# Patient Record
Sex: Female | Born: 1965 | Race: White | Hispanic: No | Marital: Married | Smoking: Never smoker
Health system: Southern US, Community
[De-identification: ages and names within clinical notes are randomized; demographics above are authoritative.]

---
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
## 1989-06-12 HISTORY — PX: GALLBLADDER SURGERY: SHX652

## 1998-09-20 ENCOUNTER — Other Ambulatory Visit: Admission: RE | Admit: 1998-09-20 | Discharge: 1998-09-20 | Payer: Self-pay | Admitting: Obstetrics and Gynecology

## 1999-09-20 ENCOUNTER — Other Ambulatory Visit: Admission: RE | Admit: 1999-09-20 | Discharge: 1999-09-20 | Payer: Self-pay | Admitting: Obstetrics and Gynecology

## 2000-02-28 ENCOUNTER — Encounter: Payer: Self-pay | Admitting: Obstetrics and Gynecology

## 2000-02-28 ENCOUNTER — Encounter: Admission: RE | Admit: 2000-02-28 | Discharge: 2000-02-28 | Payer: Self-pay | Admitting: Obstetrics and Gynecology

## 2000-09-24 ENCOUNTER — Other Ambulatory Visit: Admission: RE | Admit: 2000-09-24 | Discharge: 2000-09-24 | Payer: Self-pay | Admitting: Obstetrics and Gynecology

## 2000-12-07 ENCOUNTER — Ambulatory Visit (HOSPITAL_COMMUNITY): Admission: RE | Admit: 2000-12-07 | Discharge: 2000-12-07 | Payer: Self-pay | Admitting: Gastroenterology

## 2000-12-07 ENCOUNTER — Encounter (INDEPENDENT_AMBULATORY_CARE_PROVIDER_SITE_OTHER): Payer: Self-pay | Admitting: Specialist

## 2001-03-04 ENCOUNTER — Encounter: Payer: Self-pay | Admitting: Obstetrics and Gynecology

## 2001-03-04 ENCOUNTER — Encounter: Admission: RE | Admit: 2001-03-04 | Discharge: 2001-03-04 | Payer: Self-pay | Admitting: Obstetrics and Gynecology

## 2001-10-09 ENCOUNTER — Other Ambulatory Visit: Admission: RE | Admit: 2001-10-09 | Discharge: 2001-10-09 | Payer: Self-pay | Admitting: Obstetrics and Gynecology

## 2002-03-10 ENCOUNTER — Encounter: Admission: RE | Admit: 2002-03-10 | Discharge: 2002-03-10 | Payer: Self-pay | Admitting: Obstetrics and Gynecology

## 2002-03-10 ENCOUNTER — Encounter: Payer: Self-pay | Admitting: Obstetrics and Gynecology

## 2002-12-03 ENCOUNTER — Other Ambulatory Visit: Admission: RE | Admit: 2002-12-03 | Discharge: 2002-12-03 | Payer: Self-pay | Admitting: Obstetrics and Gynecology

## 2004-05-03 ENCOUNTER — Other Ambulatory Visit: Admission: RE | Admit: 2004-05-03 | Discharge: 2004-05-03 | Payer: Self-pay | Admitting: Obstetrics and Gynecology

## 2005-12-15 ENCOUNTER — Encounter: Admission: RE | Admit: 2005-12-15 | Discharge: 2005-12-15 | Payer: Self-pay | Admitting: Obstetrics and Gynecology

## 2005-12-22 ENCOUNTER — Encounter (INDEPENDENT_AMBULATORY_CARE_PROVIDER_SITE_OTHER): Payer: Self-pay | Admitting: Specialist

## 2005-12-22 ENCOUNTER — Encounter: Admission: RE | Admit: 2005-12-22 | Discharge: 2005-12-22 | Payer: Self-pay | Admitting: Obstetrics and Gynecology

## 2006-02-07 ENCOUNTER — Encounter (INDEPENDENT_AMBULATORY_CARE_PROVIDER_SITE_OTHER): Payer: Self-pay | Admitting: *Deleted

## 2006-02-07 ENCOUNTER — Ambulatory Visit (HOSPITAL_BASED_OUTPATIENT_CLINIC_OR_DEPARTMENT_OTHER): Admission: RE | Admit: 2006-02-07 | Discharge: 2006-02-07 | Payer: Self-pay | Admitting: General Surgery

## 2006-02-07 ENCOUNTER — Encounter: Admission: RE | Admit: 2006-02-07 | Discharge: 2006-02-07 | Payer: Self-pay | Admitting: General Surgery

## 2006-12-19 ENCOUNTER — Encounter: Admission: RE | Admit: 2006-12-19 | Discharge: 2006-12-19 | Payer: Self-pay | Admitting: Obstetrics and Gynecology

## 2007-12-06 ENCOUNTER — Encounter: Admission: RE | Admit: 2007-12-06 | Discharge: 2007-12-06 | Payer: Self-pay | Admitting: Obstetrics and Gynecology

## 2008-12-08 ENCOUNTER — Encounter: Admission: RE | Admit: 2008-12-08 | Discharge: 2008-12-08 | Payer: Self-pay | Admitting: Obstetrics and Gynecology

## 2009-06-12 HISTORY — PX: BREAST LUMPECTOMY: SHX2

## 2010-01-24 ENCOUNTER — Encounter: Admission: RE | Admit: 2010-01-24 | Discharge: 2010-01-24 | Payer: Self-pay | Admitting: Obstetrics and Gynecology

## 2010-03-12 ENCOUNTER — Emergency Department (HOSPITAL_BASED_OUTPATIENT_CLINIC_OR_DEPARTMENT_OTHER): Admission: EM | Admit: 2010-03-12 | Discharge: 2010-03-13 | Payer: Self-pay | Admitting: Emergency Medicine

## 2010-07-04 ENCOUNTER — Encounter: Payer: Self-pay | Admitting: Obstetrics and Gynecology

## 2010-10-28 NOTE — Op Note (Signed)
NAMEYAMILEX, BORGWARDT              ACCOUNT NO.:  [id number]   MEDICAL RECORD NO.:  [id number]          PATIENT TYPE:  AMB   LOCATION:  DSC                          FACILITY:  [facility]   PHYSICIAN:  Rose Phi. Young, M.D.   DATE OF BIRTH:  07/26/1965   DATE OF PROCEDURE:  02/07/2006  DATE OF DISCHARGE:                                 OPERATIVE REPORT   PREOPERATIVE DIAGNOSIS:  Abnormal left breast mammogram with  microcalcifications and radial scar in the medial portion of the left  breast.   POSTOPERATIVE DIAGNOSIS:  Abnormal left breast mammogram with  microcalcifications and radial scar in the medial portion of the left  breast.   OPERATION:  Left breast biopsy with needle localization and specimen  mammogram.   SURGEON:  Rose Phi. Maple Hudson, M.D.   ANESTHESIA:  General.   OPERATIVE PROCEDURE:  Prior to coming to the operating room a localizing  wire had been placed in the abnormal area in the medial portion of the left  breast.   After suitable general anesthesia was induced the patient was placed in the  supine position with the arms extended on the arm board.  The left breast  was prepped and draped in the usual fashion.   The approach of the needle was from medial to the lateral in the medial  portion of the left breast, and a short radial incision was then outlined  and the area infiltrated with 0.25% Marcaine with adrenaline.  The incision  was made, with the wire exposed in the incision, the area of breast was  excised.  The specimen mammogram confirmed the removal of the lesion.   We had good hemostasis.  I approximated the thin rim of breast tissue that  she had with 3-0 Vicryl and then the skin was closed with subcuticular 4-0  Monocryl and Steri-Strips.  Dressing applied.  The patient was transferred  to the recovery room in satisfactory condition having tolerated the  procedure well.      Rose Phi. Maple Hudson, M.D.  Electronically Signed     PRY/MEDQ  D:  02/07/2006   T:  02/08/2006  Job:  [id number]

## 2010-10-28 NOTE — Op Note (Signed)
[facility]  Patient:    Christie Murray, Christie Murray                       MRN: [id number] Proc. Date: 12/07/00 Adm. Date:  [id number] Attending:  Orland Mustard CC:         Genene Churn. Cyndie Chime, M.D.  Wayne C. Dorna Bloom, M.D.   Operative Report  PROCEDURE:  Esophagogastroduodenoscopy and biopsy.  MEDICATIONS:  Hurricaine Spray, fentanyl 60 mcg, Versed 6 mg IV.  INDICATIONS:  Persistent long history of dyspepsia felt to be due to esophageal reflux.  She has been on antipeptic therapy most recently with Nexium.  She has continued to be iron deficient.  In view of this, an endoscopy is performed to look for any source in her upper GI tract.  DESCRIPTION OF PROCEDURE:  The procedure had been explained to the patient and consent obtained.  The patient in the left lateral decubitus position, Olympus videoendoscope was inserted blindly into the esophagus and advanced under direct visualization.  The stomach was entered, pylorus identified and passed. The duodenum including the bulb and second portion ______ was unremarkable. Biopsies were taken to look for evidence of celiac disease.  Scope was withdrawn.  The gastric outlet was normal.  The antrum and body were completely normal without ulcer or inflammation.  The scope was ______ back in the esophagus.  There was a 1-2 cm hiatal hernia.  No gross esophagitis, esophageal ulceration, or inflammation.  The scope was withdrawn and the patient tolerated the procedure well.  She was maintained on low-flow oxygen and pulse oximetry throughout the procedure.  ASSESSMENT:  Hiatal hernia with gastroesophageal reflux.  PLAN:  We will keep on the same medications for now, specifically Nexium and iron.  We will see her back in the office in six months.  We will check biopsies for evidence of celiac disease. DD:  12/07/00 TD:  12/07/00 Job: [id number]

## 2011-01-02 ENCOUNTER — Other Ambulatory Visit: Payer: Self-pay | Admitting: Obstetrics and Gynecology

## 2011-01-02 DIAGNOSIS — Z1231 Encounter for screening mammogram for malignant neoplasm of breast: Secondary | ICD-10-CM

## 2011-01-31 ENCOUNTER — Ambulatory Visit: Payer: Self-pay

## 2011-02-08 ENCOUNTER — Ambulatory Visit
Admission: RE | Admit: 2011-02-08 | Discharge: 2011-02-08 | Disposition: A | Discharge status: Home / Self Care | Payer: 59 | Source: Ambulatory Visit | Attending: Obstetrics and Gynecology | Admitting: Obstetrics and Gynecology

## 2011-02-08 ENCOUNTER — Other Ambulatory Visit: Payer: Self-pay | Admitting: Obstetrics and Gynecology

## 2011-02-08 DIAGNOSIS — Z1231 Encounter for screening mammogram for malignant neoplasm of breast: Secondary | ICD-10-CM

## 2013-04-22 IMAGING — MG MM DIGITAL SCREENING W/ IMPLANTS {BCG}
3 series · 3 of 3 positions shown · non-contrast
Comparison: none

DG SCREENING W/IMPLANTS
Bilateral CC and MLO view(s) were taken.

DIGITAL SCREENING MAMMOGRAM WITH CAD:
The breast tissue is extremely dense.  No masses or malignant type calcifications are identified.  
Compared with prior studies.
Images were processed with CAD.

[L CC]
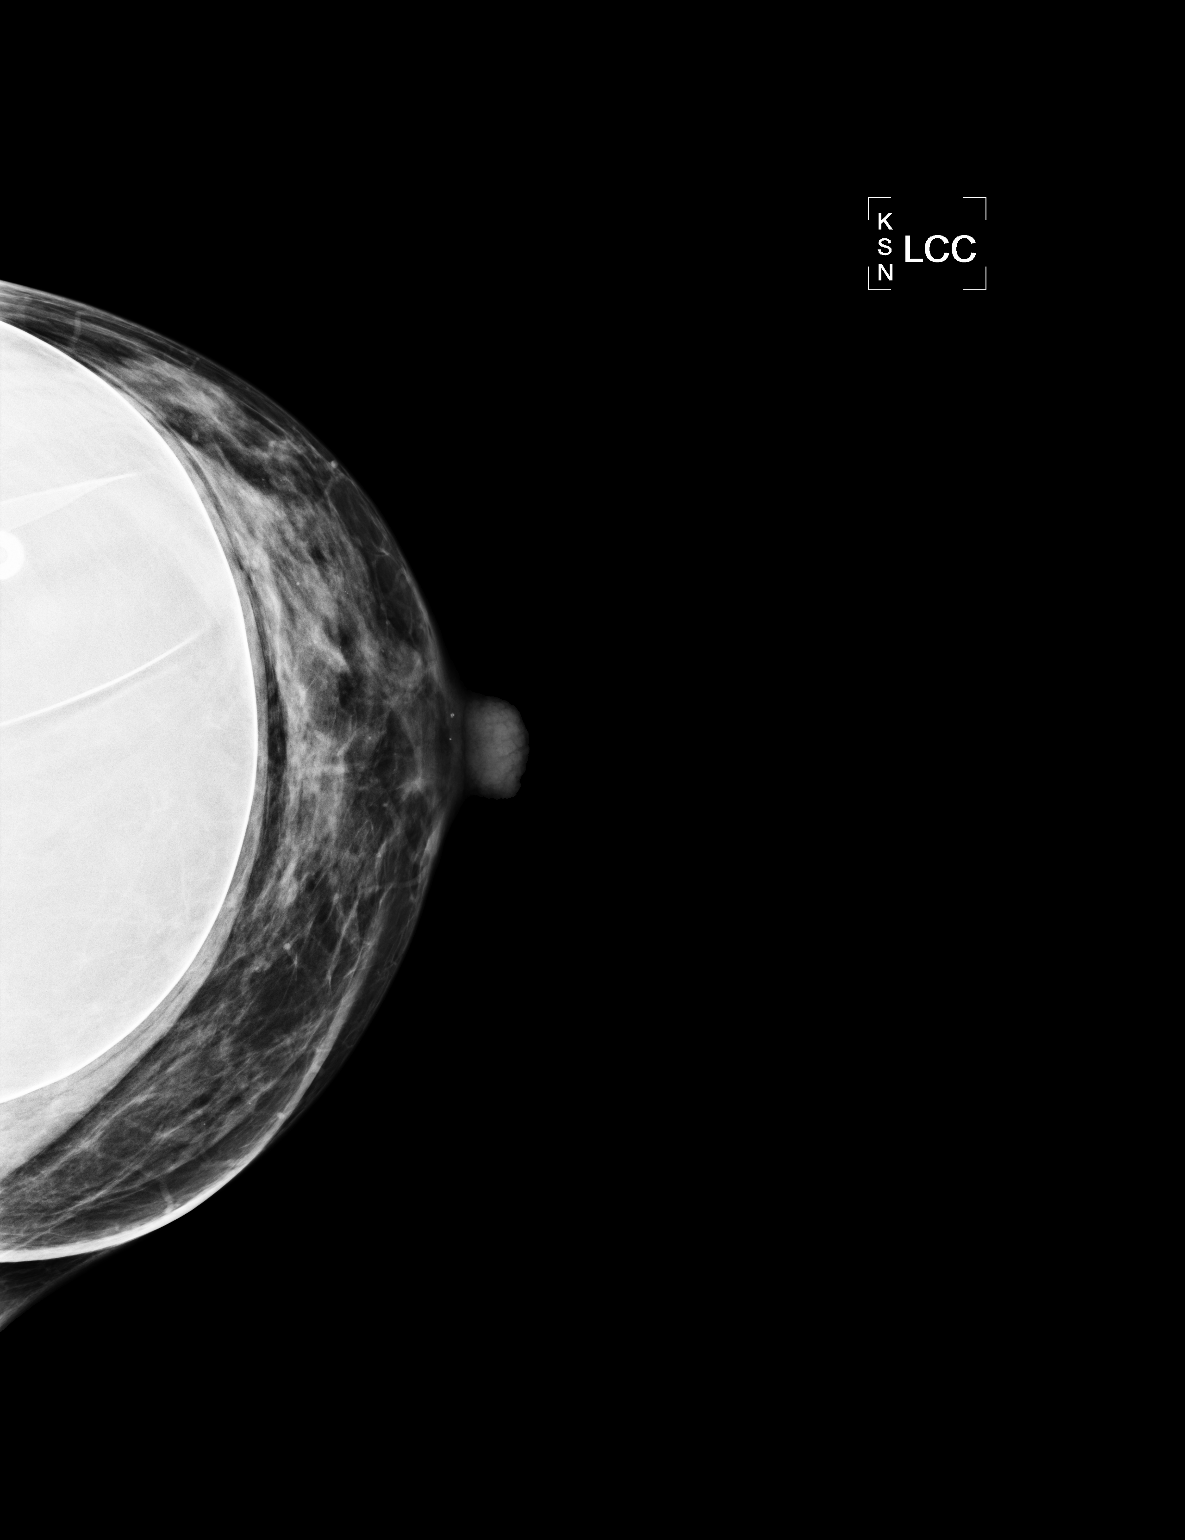

[L MLO]
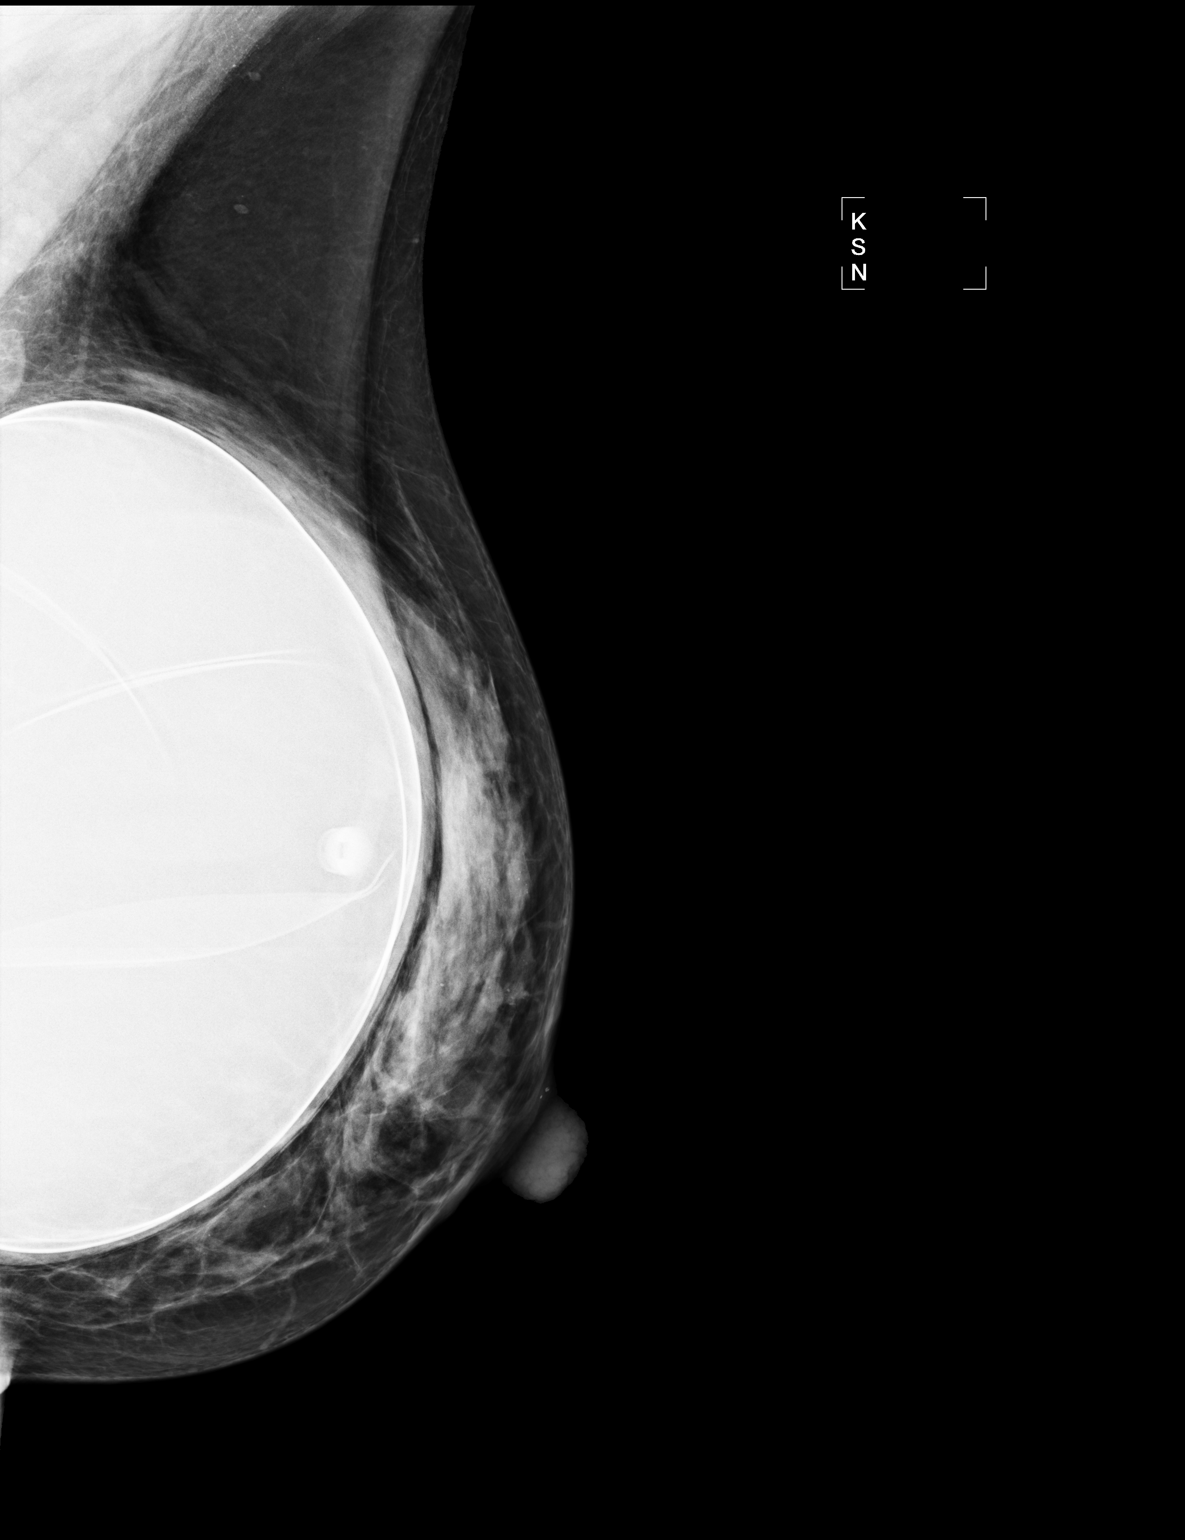

[R MLO]
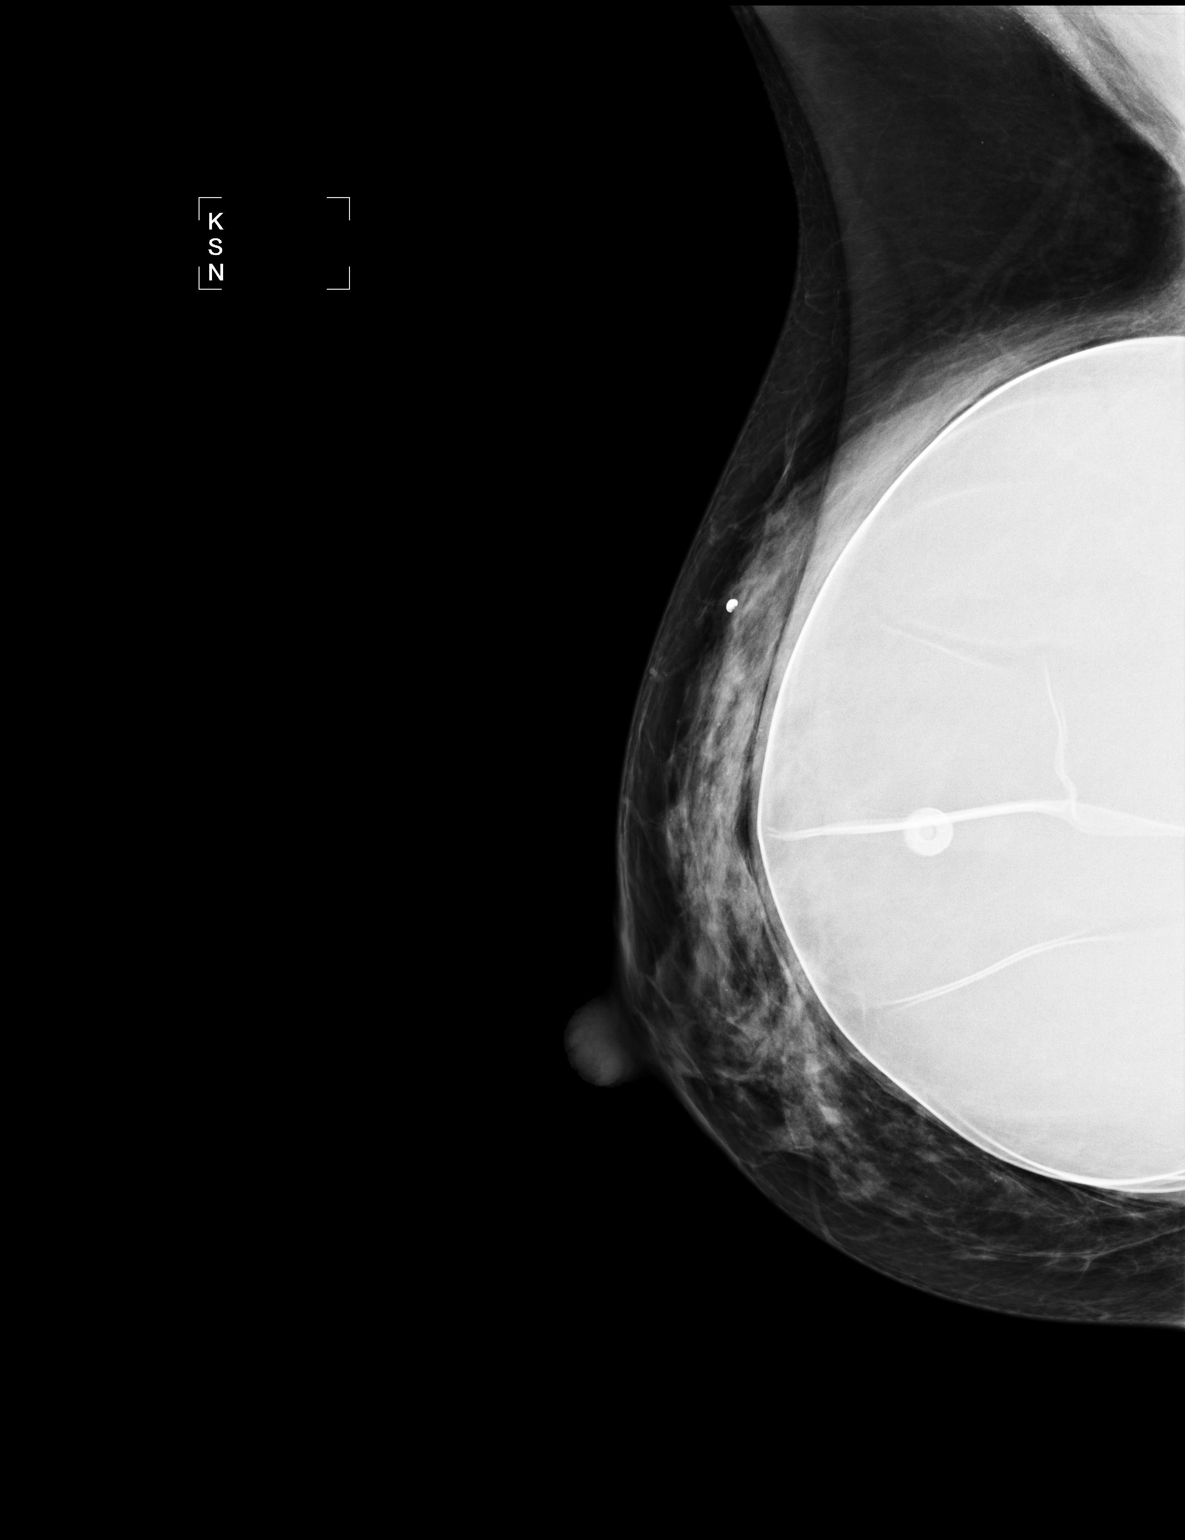

[3 of 3 positions shown; findings below may reference images not displayed]

IMPRESSION: No specific mammographic evidence of malignancy.  Next screening mammogram is recommended in one 
year.

A result letter of this screening mammogram will be mailed directly to the patient.

ASSESSMENT: Benign - BI-RADS 2

Screening mammogram in 1 year.
,

## 2014-08-05 ENCOUNTER — Other Ambulatory Visit: Payer: Self-pay | Admitting: Obstetrics and Gynecology

## 2014-08-06 ENCOUNTER — Telehealth: Payer: Self-pay | Admitting: Cardiovascular Disease

## 2014-08-06 LAB — CYTOLOGY - PAP

## 2014-08-06 NOTE — Telephone Encounter (Signed)
Received records from [facility] for appointment with Dr Allyson SabalBerry on 09/15/14.  [facility] Dr Hazle CocaBerry's schedule on 09/15/14.  lp

## 2014-09-09 ENCOUNTER — Telehealth: Payer: Self-pay | Admitting: Cardiology

## 2014-09-11 NOTE — Telephone Encounter (Signed)
Closed encounter °

## 2014-09-15 ENCOUNTER — Ambulatory Visit: Payer: 59 | Admitting: Cardiovascular Disease

## 2014-09-18 ENCOUNTER — Ambulatory Visit (INDEPENDENT_AMBULATORY_CARE_PROVIDER_SITE_OTHER): Payer: 59 | Admitting: Cardiology

## 2014-09-18 ENCOUNTER — Encounter: Payer: Self-pay | Admitting: Cardiology

## 2014-09-18 VITALS — BP 132/94 | HR 50 | Ht 64.5 in | Wt 122.9 lb

## 2014-09-18 DIAGNOSIS — I493 Ventricular premature depolarization: Secondary | ICD-10-CM | POA: Diagnosis not present

## 2014-09-18 NOTE — Progress Notes (Signed)
Cardiology Office Note   Date:  09/18/2014   ID:  Christie ArnJami M Stave, DOB 10-14-1965, MRN [id number]  PCP:  Almedia BallsKELLY,SAM, MD  Cardiologist:   Rollene RotundaJames Akire Rennert, MD   Chief Complaint  Patient presents with  . PVCs      History of Present Illness: Christie Murray is a 49 y.o. female who presents for evaluation of palpitations. She has no prior cardiac history. However, she's been noticing some skipped heartbeats. She describes these particularly at night starting about a year and a half ago. She would feel positive and a strong beat. This might happen several times but not in a row. She doesn't describe any sustained tachycardia arrhythmias. She doesn't notice it when she is active or exercising which she does quite a bit. She does noted occasionally when she is at rest now during the day and this has started more recently. She doesn't have any presyncope or syncope. She runs and does other vigorous aerobic exercise and with this she denies any chest pressure, neck or arm discomfort. She denies any shortness of breath, PND or orthopnea. She has no weight change.   No past medical history on file.  Past Surgical History  Procedure Laterality Date  . Breast lumpectomy Left 2011  . Gallbladder surgery  1991  . Appendectomy  1984  . Cesarean section  1995     Current Outpatient Prescriptions  Medication Sig Dispense Refill  . estradiol (ESTRACE) 2 MG tablet Take 1 tablet by mouth daily.    . progesterone (PROMETRIUM) 100 MG capsule Take 1 capsule by mouth daily.     No current facility-administered medications for this visit.    Allergies:   Review of patient's allergies indicates no known allergies.    Social History:  The patient  reports that she has never smoked. She does not have any smokeless tobacco history on file. She reports that she drinks about 0.6 oz of alcohol per week. She reports that she does not use illicit drugs.   Family History:  The patient's family history  includes Cancer - Lung in her mother; Stroke in her father.    ROS:  Please see the history of present illness.   Otherwise, review of systems are positive for none.   All other systems are reviewed and negative.    PHYSICAL EXAM: VS:  BP 132/94 mmHg  Pulse 50  Ht 5' 4.5" (1.638 m)  Wt 122 lb 14.4 oz (55.747 kg)  BMI 20.78 kg/m2 , BMI Body mass index is 20.78 kg/(m^2). GENERAL:  Well appearing HEENT:  Pupils equal round and reactive, fundi not visualized, oral mucosa unremarkable NECK:  No jugular venous distention, waveform within normal limits, carotid upstroke brisk and symmetric, no bruits, no thyromegaly LYMPHATICS:  No cervical, inguinal adenopathy LUNGS:  Clear to auscultation bilaterally BACK:  No CVA tenderness CHEST:  Unremarkable HEART:  PMI not displaced or sustained,S1 and S2 within normal limits, no S3, no S4, no clicks, no rubs, no murmurs ABD:  Flat, positive bowel sounds normal in frequency in pitch, no bruits, no rebound, no guarding, no midline pulsatile mass, no hepatomegaly, no splenomegaly EXT:  2 plus pulses throughout, no edema, no cyanosis no clubbing SKIN:  No rashes no nodules NEURO:  Cranial nerves II through XII grossly intact, motor grossly intact throughout PSYCH:  Cognitively intact, oriented to person place and time    EKG:  EKG is ordered today. The ekg ordered today demonstrates sinus bradycardia, rate 50, axis within  normal limits, RSR prime V1 and V2, no acute ST-T wave changes.   Recent Labs: No results found for requested labs within last 365 days.    Lipid Panel    Wt Readings from Last 3 Encounters:  09/18/14 122 lb 14.4 oz (55.747 kg)      Other studies Reviewed: Additional studies/ records that were reviewed today include: Outside office note and labs .    ASSESSMENT AND PLAN:  PALPITATIONS:  The patient is likely describing PVCs or PACs. She has an absolutely normal exam and normal EKG. Given this the likelihood of some  more sustained or significant dysrhythmia is low. We talked about the pathophysiology of this and treatment for some time. She would prefer no medical therapy but will let me know if this gets worse with time. No further imaging is indicated.  ELEVATED LDL:  Of note her LDL was 161 but her HDL is 94. She has no vascular disease or other high-risk findings. Treat as needed.   Current medicines are reviewed at length with the patient today.  The patient does not have concerns regarding medicines.  The following changes have been made:  no change  Labs/ tests ordered today include: None     Disposition:   FU with me as needed.     Signed, Rollene Rotunda, MD  09/18/2014 1:03 PM    [facility]

## 2014-09-18 NOTE — Patient Instructions (Signed)
Follow up with Dr Hochrein as needed.  

## 2016-08-20 ENCOUNTER — Emergency Department (HOSPITAL_BASED_OUTPATIENT_CLINIC_OR_DEPARTMENT_OTHER)
Admission: EM | Admit: 2016-08-20 | Discharge: 2016-08-20 | Disposition: A | Discharge status: Home / Self Care | Payer: 59 | Attending: Emergency Medicine | Admitting: Emergency Medicine

## 2016-08-20 ENCOUNTER — Encounter (HOSPITAL_BASED_OUTPATIENT_CLINIC_OR_DEPARTMENT_OTHER): Payer: Self-pay

## 2016-08-20 DIAGNOSIS — H5711 Ocular pain, right eye: Secondary | ICD-10-CM | POA: Diagnosis present

## 2016-08-20 DIAGNOSIS — Z79899 Other long term (current) drug therapy: Secondary | ICD-10-CM | POA: Diagnosis not present

## 2016-08-20 DIAGNOSIS — H1031 Unspecified acute conjunctivitis, right eye: Secondary | ICD-10-CM | POA: Diagnosis not present

## 2016-08-20 DIAGNOSIS — B309 Viral conjunctivitis, unspecified: Secondary | ICD-10-CM

## 2016-08-20 MED ORDER — FLUORESCEIN SODIUM 0.6 MG OP STRP
ORAL_STRIP | OPHTHALMIC | Status: AC
  Filled 2016-08-20: qty 1

## 2016-08-20 MED ORDER — TETRACAINE HCL 0.5 % OP SOLN
OPHTHALMIC | Status: AC
  Filled 2016-08-20: qty 4

## 2016-08-20 MED ORDER — POLYMYXIN B-TRIMETHOPRIM 10000-0.1 UNIT/ML-% OP SOLN: 1.0000 [drp] | OPHTHALMIC | 0 refills | Status: DC

## 2016-08-20 NOTE — ED Notes (Signed)
Pt discharged to home with family. NAD.  

## 2016-08-20 NOTE — ED Triage Notes (Signed)
Right eye pain with redness and drainage. Reports using drops but not helping today.

## 2016-08-20 NOTE — ED Provider Notes (Signed)
[facility]   CSN: [id number] Arrival date & time: 08/20/16  1227     History   Chief Complaint Chief Complaint  Patient presents with  . Eye Pain    HPI Christie Murray is a 51 y.o. female.  HPI  51 year old female presenting with redness and pain to the right eye. Woke up this morning with the symptoms. Slight improvement since waking up. She freely has dry eyes and uses eyedrops on most daily. She does not follow with an eye specialist. However today she has been having the redness in the eyes and a little bit of periorbital swelling throughout the day. She thinks this might be because she's been rubbing it so much. No headache, nausea, or vomiting. She's had a little rhinorrhea today. She's had an increase in clear watery discharge. Husband recalls that they were in a hot tub last night. She does not wear contacts or glasses. She thinks her right eye is a little bit blurry. Describes the pain as a burning sensation. It feels like there's a scratching or foreign body in her eye. She is not any trauma.  History reviewed. No pertinent past medical history.  Patient Active Problem List   Diagnosis Date Noted  . PVC (premature ventricular contraction) 09/18/2014    Past Surgical History:  Procedure Laterality Date  . APPENDECTOMY  1984  . BREAST LUMPECTOMY Left 2011  . CESAREAN SECTION  1995  . GALLBLADDER SURGERY  1991    OB History    No data available       Home Medications    Prior to Admission medications   Medication Sig Start Date End Date Taking? Authorizing Provider  estradiol (ESTRACE) 2 MG tablet Take 1 tablet by mouth daily. 09/01/14   Historical Provider, MD  progesterone (PROMETRIUM) 100 MG capsule Take 1 capsule by mouth daily. 09/01/14   Historical Provider, MD  trimethoprim-polymyxin b (POLYTRIM) ophthalmic solution Place 1 drop into the right eye every 4 (four) hours. For 7 days 08/20/16   Pricilla Loveless, MD    Family  History Family History  Problem Relation Age of Onset  . Cancer - Lung Mother     Died age 57  . Stroke Father   . Heart disease Maternal Grandmother     Lived into her 43s    Social History Social History  Substance Use Topics  . Smoking status: Never Smoker  . Smokeless tobacco: Never Used  . Alcohol use 0.6 oz/week    1 Glasses of wine per week     Allergies   Patient has no known allergies.   Review of Systems Review of Systems  Constitutional: Negative for fever.  HENT: Positive for rhinorrhea. Negative for sore throat.   Eyes: Positive for photophobia, pain, discharge (clear), redness, itching and visual disturbance.  Respiratory: Negative for cough and shortness of breath.   Gastrointestinal: Negative for vomiting.  Neurological: Negative for headaches.  All other systems reviewed and are negative.    Physical Exam Updated Vital Signs BP 143/97 (BP Location: Left Arm)   Pulse (!) 55   Temp 99 F (37.2 C) (Oral)   Resp 22   Ht 5' 4.5" (1.638 m)   Wt 117 lb 3.2 oz (53.2 kg)   SpO2 100%   BMI 19.81 kg/m   Physical Exam  Constitutional: She is oriented to person, place, and time. She appears well-developed and well-nourished.  HENT:  Head: Normocephalic and atraumatic.  Right Ear: External ear  normal.  Left Ear: External ear normal.  Nose: Nose normal.  Mild right lid swelling with faint erythema. No significant tenderness or induration  Eyes: EOM are normal. Pupils are equal, round, and reactive to light. Right eye exhibits discharge (watery). Right eye exhibits no exudate. Left eye exhibits no discharge. Right conjunctiva is injected.  Slit lamp exam:      The right eye shows no corneal abrasion and no fluorescein uptake.  IOP 20 in right eye No obvious fluorescein uptake  Cardiovascular: Normal rate, regular rhythm and normal heart sounds.   Pulmonary/Chest: Effort normal and breath sounds normal.  Abdominal: Soft. There is no tenderness.    Neurological: She is alert and oriented to person, place, and time.  Skin: Skin is warm and dry.  Nursing note and vitals reviewed.    ED Treatments / Results  Labs (all labs ordered are listed, but only abnormal results are displayed) Labs Reviewed - No data to display  EKG  EKG Interpretation None       Radiology No results found.  Procedures Procedures (including critical care time)  Medications Ordered in ED Medications  fluorescein 0.6 MG ophthalmic strip (not administered)  tetracaine (PONTOCAINE) 0.5 % ophthalmic solution (not administered)     Initial Impression / Assessment and Plan / ED Course  I have reviewed the triage vital signs and the nursing notes.  Pertinent labs & imaging results that were available during my care of the patient were reviewed by me and considered in my medical decision making (see chart for details).     Patient's eye is most likely viral conjunctivitis. There is no purulent discharge. However given that she was in a hot tub last night, cover for bacterial causes. Highly doubt glaucoma or more severe eye pathology. Pain completely resolved after tetracaine. Discussed return precautions, follow up with ophthalmology.   Final Clinical Impressions(s) / ED Diagnoses   Final diagnoses:  Acute viral conjunctivitis of right eye    New Prescriptions New Prescriptions   TRIMETHOPRIM-POLYMYXIN B (POLYTRIM) OPHTHALMIC SOLUTION    Place 1 drop into the right eye every 4 (four) hours. For 7 days     Pricilla LovelessScott Aneesh Faller, MD 08/20/16 1402

## 2019-05-01 DIAGNOSIS — I10 Essential (primary) hypertension: Secondary | ICD-10-CM | POA: Insufficient documentation

## 2021-01-25 ENCOUNTER — Telehealth: Payer: Self-pay | Admitting: Family

## 2021-01-25 ENCOUNTER — Ambulatory Visit: Payer: 59 | Admitting: Family

## 2021-01-25 NOTE — Telephone Encounter (Signed)
I have called pt on both numbers and one phone just kept ringing and the other one I have left a message to call back.

## 2021-01-25 NOTE — Telephone Encounter (Signed)
Can you call to make sure she didn't get told a different date for her NP appointment? She no-showed today at 2 and appointment was made last Friday.

## 2021-01-27 ENCOUNTER — Other Ambulatory Visit: Payer: Self-pay | Admitting: Family

## 2021-01-28 NOTE — Telephone Encounter (Signed)
Pt called back and made another appt in Nov.

## 2021-03-03 DIAGNOSIS — F419 Anxiety disorder, unspecified: Secondary | ICD-10-CM | POA: Insufficient documentation

## 2021-04-15 ENCOUNTER — Ambulatory Visit: Payer: 59 | Admitting: Family

## 2021-08-16 LAB — HM MAMMOGRAPHY

## 2021-08-17 LAB — HM PAP SMEAR: HM Pap smear: NEGATIVE

## 2021-08-30 ENCOUNTER — Encounter: Payer: Self-pay | Admitting: Family

## 2021-08-30 ENCOUNTER — Ambulatory Visit (INDEPENDENT_AMBULATORY_CARE_PROVIDER_SITE_OTHER): Payer: 59 | Admitting: Family

## 2021-08-30 VITALS — BP 124/80 | HR 65 | Temp 98.9°F | Resp 18 | Ht 65.0 in | Wt 127.6 lb

## 2021-08-30 DIAGNOSIS — F419 Anxiety disorder, unspecified: Secondary | ICD-10-CM | POA: Diagnosis not present

## 2021-08-30 DIAGNOSIS — I10 Essential (primary) hypertension: Secondary | ICD-10-CM | POA: Diagnosis not present

## 2021-08-30 MED ORDER — HYDROCHLOROTHIAZIDE 25 MG PO TABS: 25.0000 mg | ORAL_TABLET | Freq: Every day | ORAL | 3 refills | Status: DC

## 2021-08-30 NOTE — Progress Notes (Signed)
? ?  Christie Murray is a 56 y.o. female with the following history as recorded in EpicCare:  ?Patient Active Problem List  ? Diagnosis Date Noted  ? Anxiety 03/03/2021  ? Benign essential hypertension 05/01/2019  ? PVC (premature ventricular contraction) 09/18/2014  ?  ?Current Outpatient Medications  ?Medication Sig Dispense Refill  ? escitalopram (LEXAPRO) 10 MG tablet Take 10 mg by mouth daily.    ? ESTROGEL 0.75 MG/1.25 GM (0.06%) topical gel SMARTSIG:1 pump Topical Daily    ? progesterone (PROMETRIUM) 100 MG capsule Take 100 mg by mouth daily.    ? hydrochlorothiazide (HYDRODIURIL) 25 MG tablet Take 1 tablet (25 mg total) by mouth daily. 90 tablet 3  ? ?No current facility-administered medications for this visit.  ?  ?Allergies: Patient has no known allergies.  ?History reviewed. No pertinent past medical history.  ?Past Surgical History:  ?Procedure Laterality Date  ? APPENDECTOMY  1984  ? BREAST LUMPECTOMY Left 2011  ? CESAREAN SECTION  1995  ? GALLBLADDER SURGERY  1991  ?  ?Family History  ?Problem Relation Age of Onset  ? Cancer - Lung Mother   ?     Died age 28  ? Stroke Father   ? Heart disease Maternal Grandmother   ?     Lived into her 66s  ?  ?Social History  ? ?Tobacco Use  ? Smoking status: Never  ? Smokeless tobacco: Never  ?Substance Use Topics  ? Alcohol use: Yes  ?  Alcohol/week: 1.0 standard drink  ?  Types: 1 Glasses of wine per week  ?  ?Subjective:  ?Presents today as a new patient; no acute concerns; ?History of HTN- stable on HCTZ; sees GYN regularly- Dr. Rana Snare at [facility];  ?Scheduled for colonoscopy later in April; 5 year follow up; ?Sees dermatology regularly as well;  ? ?Denies any chest pain, shortness of breath, blurred vision or headache ?Exercises regularly;  ? ? ? ?Objective:  ?Vitals:  ? 08/30/21 1007  ?BP: 124/80  ?Pulse: 65  ?Resp: 18  ?Temp: 98.9 ?F (37.2 ?C)  ?TempSrc: Oral  ?SpO2: 97%  ?Weight: 127 lb 9.6 oz (57.9 kg)  ?Height: 5\' 5"  (1.651 m)  ?  ?General: Well  developed, well nourished, in no acute distress  ?Skin : Warm and dry.  ?Head: Normocephalic and atraumatic  ?Lungs: Respirations unlabored; clear to auscultation bilaterally without wheeze, rales, rhonchi  ?CVS exam: normal rate and regular rhythm.  ?Neurologic: Alert and oriented; speech intact; face symmetrical; moves all extremities well; CNII-XII intact without focal deficit  ? ?Assessment:  ?1. Benign essential hypertension   ?2. Anxiety   ?  ?Plan:  ?Stable; refills updated; ?Stable; continue same medication; ?Reviewed labs that patient had drawn last week with former PCP; congratulated patient on her commitment to her health;  ? ?This visit occurred during the SARS-CoV-2 public health emergency.  Safety protocols were in place, including screening questions prior to the visit, additional usage of staff PPE, and extensive cleaning of exam room while observing appropriate contact time as indicated for disinfecting solutions.  ? ? ?Return in about 6 months (around 03/04/2022) for CPE after 03/04/2022 due to insurance.  ?No orders of the defined types were placed in this encounter. ?  ?Requested Prescriptions  ? ?Signed Prescriptions Disp Refills  ? hydrochlorothiazide (HYDRODIURIL) 25 MG tablet 90 tablet 3  ?  Sig: Take 1 tablet (25 mg total) by mouth daily.  ?  ? ?

## 2021-09-08 ENCOUNTER — Encounter: Payer: Self-pay | Admitting: *Deleted

## 2021-09-21 LAB — HM COLONOSCOPY

## 2022-01-25 ENCOUNTER — Telehealth: Payer: 59 | Admitting: Nurse Practitioner

## 2022-01-25 DIAGNOSIS — Z20822 Contact with and (suspected) exposure to covid-19: Secondary | ICD-10-CM

## 2022-01-25 MED ORDER — MOLNUPIRAVIR EUA 200MG CAPSULE: 4.0000 | ORAL_CAPSULE | Freq: Two times a day (BID) | ORAL | 0 refills | Status: AC

## 2022-01-25 MED ORDER — PROMETHAZINE-DM 6.25-15 MG/5ML PO SYRP: 5.0000 mL | ORAL_SOLUTION | Freq: Four times a day (QID) | ORAL | 0 refills | Status: DC | PRN

## 2022-01-25 NOTE — Progress Notes (Signed)
Virtual Visit Consent   [name], you are scheduled for a virtual visit with Mary-Margaret Daphine Deutscher, FNP, a [facility] provider, today.     Just as with appointments in the office, your consent must be obtained to participate.  Your consent will be active for this visit and any virtual visit you may have with one of our providers in the next 365 days.     If you have a MyChart account, a copy of this consent can be sent to you electronically.  All virtual visits are billed to your insurance company just like a traditional visit in the office.    As this is a virtual visit, video technology does not allow for your provider to perform a traditional examination.  This may limit your provider's ability to fully assess your condition.  If your provider identifies any concerns that need to be evaluated in person or the need to arrange testing (such as labs, EKG, etc.), we will make arrangements to do so.     Although advances in technology are sophisticated, we cannot ensure that it will always work on either your end or our end.  If the connection with a video visit is poor, the visit may have to be switched to a telephone visit.  With either a video or telephone visit, we are not always able to ensure that we have a secure connection.     I need to obtain your verbal consent now.   Are you willing to proceed with your visit today? YES   HAGEN TIDD has provided verbal consent on 01/25/2022 for a virtual visit (video or telephone).   Mary-Margaret Daphine Deutscher, FNP   Date: 01/25/2022 10:15 AM   Virtual Visit via Video Note   I, Mary-Margaret Fotini Lemus, connected with Christie Murray ([id number], 02-Sep-1965) on 01/25/22 at 10:15 AM EDT by a video-enabled telemedicine application and verified that I am speaking with the correct person using two identifiers.  Location: Patient: Virtual Visit Location Patient: Home Provider: Virtual Visit Location Provider: Mobile   I discussed the  limitations of evaluation and management by telemedicine and the availability of in person appointments. The patient expressed understanding and agreed to proceed.    History of Present Illness: Christie Murray is a 56 y.o. who identifies as a female who was assigned female at birth, and is being seen today for covid exposure.  HPI: Patient states her husband tested positive on Monday. Now she is developing th esame syptoms that he did in the same order. Her covid test was negative this morning.  URI  This is a new problem. The current episode started yesterday. The problem has been gradually worsening. The maximum temperature recorded prior to her arrival was 100.4 - 100.9 F. The fever has been present for 1 to 2 days. Associated symptoms include congestion, coughing, rhinorrhea and a sore throat. She has tried acetaminophen for the symptoms. The treatment provided mild relief.    Review of Systems  HENT:  Positive for congestion, rhinorrhea and sore throat.   Respiratory:  Positive for cough.     Problems:  Patient Active Problem List   Diagnosis Date Noted   Anxiety 03/03/2021   Benign essential hypertension 05/01/2019   PVC (premature ventricular contraction) 09/18/2014    Allergies: No Known Allergies Medications:  Current Outpatient Medications:    escitalopram (LEXAPRO) 10 MG tablet, Take 10 mg by mouth daily., Disp: , Rfl:    ESTROGEL 0.75 MG/1.25 GM (0.06%) topical  gel, SMARTSIG:1 pump Topical Daily, Disp: , Rfl:    hydrochlorothiazide (HYDRODIURIL) 25 MG tablet, Take 1 tablet (25 mg total) by mouth daily., Disp: 90 tablet, Rfl: 3   progesterone (PROMETRIUM) 100 MG capsule, Take 100 mg by mouth daily., Disp: , Rfl:   Observations/Objective: Patient is well-developed, well-nourished in no acute distress.  Resting comfortably  at home.  Head is normocephalic, atraumatic.  No labored breathing.  Speech is clear and coherent with logical content.  Patient is alert and  oriented at baseline.  Face flushed Deep dry cough  Assessment and Plan:  Christie Murray in today with chief complaint of Covid Exposure and URI   1. Exposure to confirmed case of COVID-19 1. Take meds as prescribed 2. Use a cool mist humidifier especially during the winter months and when heat has been humid. 3. Use saline nose sprays frequently 4. Saline irrigations of the nose can be very helpful if done frequently.  * 4X daily for 1 week*  * Use of a nettie pot can be helpful with this. Follow directions with this* 5. Drink plenty of fluids 6. Keep thermostat turn down low 7.For any cough or congestion- promethazine dm 8. For fever or aces or pains- take tylenol or ibuprofen appropriate for age and weight.  * for fevers greater than 101 orally you may alternate ibuprofen and tylenol every  3 hours.   Meds ordered this encounter  Medications   molnupiravir EUA (LAGEVRIO) 200 mg CAPS capsule    Sig: Take 4 capsules (800 mg total) by mouth 2 (two) times daily for 5 days.    Dispense:  40 capsule    Refill:  0    Order Specific Question:   Supervising Provider    Answer:   Eber Hong [[id number]]   promethazine-dextromethorphan (PROMETHAZINE-DM) 6.25-15 MG/5ML syrup    Sig: Take 5 mLs by mouth 4 (four) times daily as needed for cough.    Dispense:  118 mL    Refill:  0    Order Specific Question:   Supervising Provider    Answer:   Eber Hong [[id number]]      Follow Up Instructions: I discussed the assessment and treatment plan with the patient. The patient was provided an opportunity to ask questions and all were answered. The patient agreed with the plan and demonstrated an understanding of the instructions.  A copy of instructions were sent to the patient via MyChart.  The patient was advised to call back or seek an in-person evaluation if the symptoms worsen or if the condition fails to improve as anticipated.  Time:  I spent 8 minutes with the patient via telehealth  technology discussing the above problems/concerns.    Mary-Margaret Daphine Deutscher, FNP

## 2022-01-25 NOTE — Patient Instructions (Signed)
Quarantine and Isolation Quarantine and isolation refer to local and travel restrictions to protect the public and travelers from contagious diseases that constitute a public health threat. Contagious diseases are diseases that can spread from one person to another. Quarantine and isolation help to protect the public by preventing exposure to people who have or may have a contagious disease. Isolation separates people who are sick with a contagious disease from people who are not sick. Quarantine separates and restricts the movement of people who were exposed to a contagious disease to see if they become sick. You may be put in quarantine or isolation if you have been exposed to or diagnosed with any of the following diseases: Severe acute respiratory syndromes, such as COVID-19. Cholera. Diphtheria. Tuberculosis. Plague. Smallpox. Yellow fever. Viral hemorrhagic fevers, such as [place], Ebola, and [place]. When to quarantine or isolate Follow these rules, whether you have been vaccinated or not: Stay home and isolate from others when you are sick with a contagious disease. Isolate when you test positive for a contagious disease, even if you do not have symptoms. Isolate if you are sick and suspect that you may have a contagious disease. If you suspect that you have a contagious disease, get tested. If your test results are negative, you can end your isolation. If your test results are positive, follow the full isolation recommendations as told by your health care provider or local health authorities. Quarantine and stay away from others when you have been in close contact with someone who has tested positive for a contagious disease. Close contact is defined as being less than 6 ft (1.8 m) away from an infected person for a total of 15 minutes or more over a 24-hour period. Do not go to places where you are unable to wear a mask, such as restaurants and some gyms. Stay home and separate  from others as much as possible. Avoid being around people who may get very sick from the contagious disease that you have. Use a separate bathroom, if possible. Do not travel. For travel guidance, visit the CDC's travel webpage at wwwnc.cdc.gov/travel/ Follow these instructions at home: Medicines  Take over-the-counter and prescription medicines as told by your health care provider. Finish all antibiotic medicine even when you start to feel better. Stay up to date with all your vaccines. Get scheduled vaccines and boosters as recommended by your health care provider. Lifestyle Wear a high-quality mask if you must be around others at home and in public, if recommended. Improve air flow (ventilation) at home to help prevent the disease from spreading to other people, if possible. Do not share personal household items, like cups, towels, and utensils. Practice everyday hygiene and cleaning. General instructions Talk to your health care provider if you have a weakened body defense system (immune system). People with a weakened immune system may have a reduced immune response to vaccines. You may need to follow current prevention measures, including wearing a well-fitting mask, avoiding crowds, and avoiding poorly ventilated indoor places. Monitor symptoms and follow health care provider instructions, which may include resting, drinking fluids, and taking medicines. Follow specific isolation and quarantine recommendations if you are in places that can lead to disease outbreaks, such as correctional and detention facilities, homeless shelters, and cruise ships. Return to your normal activities as told by your health care provider. Ask your health care provider what activities are safe for you. Keep all follow-up visits. This is important. Where to find more information CDC: www.cdc.gov/quarantine/index.html Contact   a health care provider if: You have a fever. You have signs and symptoms that  return or get worse after isolation. Get help right away if: You have difficulty breathing. You have chest pain. These symptoms may be an emergency. Get help right away. Call 911. Do not wait to see if the symptoms will go away. Do not drive yourself to the hospital. Summary Isolation and quarantine help protect the public by preventing exposure to people who have or may have a contagious disease. Isolate when you are sick or when you test positive, even if you do not have symptoms. Quarantine and stay away from others when you have been in close contact with someone who has tested positive for a contagious disease. This information is not intended to replace advice given to you by your health care provider. Make sure you discuss any questions you have with your health care provider. Document Revised: 06/09/2021 Document Reviewed: 05/19/2021 Elsevier Patient Education  2023 Elsevier Inc.  

## 2022-03-07 ENCOUNTER — Ambulatory Visit (INDEPENDENT_AMBULATORY_CARE_PROVIDER_SITE_OTHER): Payer: 59 | Admitting: Family

## 2022-03-07 ENCOUNTER — Encounter: Payer: Self-pay | Admitting: Family

## 2022-03-07 VITALS — BP 120/88 | HR 58 | Temp 98.1°F | Ht 65.0 in | Wt 134.6 lb

## 2022-03-07 DIAGNOSIS — Z0000 Encounter for general adult medical examination without abnormal findings: Secondary | ICD-10-CM | POA: Diagnosis not present

## 2022-03-07 DIAGNOSIS — Z1159 Encounter for screening for other viral diseases: Secondary | ICD-10-CM

## 2022-03-07 DIAGNOSIS — Z13220 Encounter for screening for lipoid disorders: Secondary | ICD-10-CM

## 2022-03-07 DIAGNOSIS — R899 Unspecified abnormal finding in specimens from other organs, systems and tissues: Secondary | ICD-10-CM

## 2022-03-07 LAB — CBC WITH DIFFERENTIAL/PLATELET
Basophils Absolute: 0.1 10*3/uL (ref 0.0–0.1)
Basophils Relative: 1.1 % (ref 0.0–3.0)
Eosinophils Absolute: 0.1 10*3/uL (ref 0.0–0.7)
Eosinophils Relative: 1.5 % (ref 0.0–5.0)
HCT: 39.3 % (ref 36.0–46.0)
Hemoglobin: 13.8 g/dL (ref 12.0–15.0)
Lymphocytes Relative: 58.1 % — ABNORMAL HIGH (ref 12.0–46.0)
Lymphs Abs: 2.9 10*3/uL (ref 0.7–4.0)
MCHC: 35.1 g/dL (ref 30.0–36.0)
MCV: 96.8 fl (ref 78.0–100.0)
Monocytes Absolute: 0.5 10*3/uL (ref 0.1–1.0)
Monocytes Relative: 10.4 % (ref 3.0–12.0)
Neutro Abs: 1.5 10*3/uL (ref 1.4–7.7)
Neutrophils Relative %: 28.9 % — ABNORMAL LOW (ref 43.0–77.0)
Platelets: 267 10*3/uL (ref 150.0–400.0)
RBC: 4.06 Mil/uL (ref 3.87–5.11)
RDW: 12.6 % (ref 11.5–15.5)
WBC: 5 10*3/uL (ref 4.0–10.5)

## 2022-03-07 LAB — COMPREHENSIVE METABOLIC PANEL
ALT: 21 U/L (ref 0–35)
AST: 32 U/L (ref 0–37)
Albumin: 4.6 g/dL (ref 3.5–5.2)
Alkaline Phosphatase: 56 U/L (ref 39–117)
BUN: 18 mg/dL (ref 6–23)
CO2: 31 mEq/L (ref 19–32)
Calcium: 9.6 mg/dL (ref 8.4–10.5)
Chloride: 99 mEq/L (ref 96–112)
Creatinine, Ser: 0.83 mg/dL (ref 0.40–1.20)
GFR: 78.66 mL/min (ref >60.00)
Glucose, Bld: 105 mg/dL — ABNORMAL HIGH (ref 70–99)
Potassium: 4.2 mEq/L (ref 3.5–5.1)
Sodium: 138 mEq/L (ref 135–145)
Total Bilirubin: 0.8 mg/dL (ref 0.2–1.2)
Total Protein: 7.7 g/dL (ref 6.0–8.3)

## 2022-03-07 LAB — LIPID PANEL
Cholesterol: 239 mg/dL — ABNORMAL HIGH (ref 0–200)
HDL: 69.8 mg/dL (ref >39.00)
LDL Cholesterol: 140 mg/dL — ABNORMAL HIGH (ref 0–99)
NonHDL: 169.1
Total CHOL/HDL Ratio: 3
Triglycerides: 146 mg/dL (ref 0.0–149.0)
VLDL: 29.2 mg/dL (ref 0.0–40.0)

## 2022-03-07 LAB — HEMOGLOBIN A1C: Hgb A1c MFr Bld: 5.1 % (ref 4.6–6.5)

## 2022-03-07 MED ORDER — HYDROCHLOROTHIAZIDE 25 MG PO TABS: 25.0000 mg | ORAL_TABLET | Freq: Every day | ORAL | 3 refills | Status: DC

## 2022-03-07 NOTE — Progress Notes (Signed)
Christie Murray is a 56 y.o. female with the following history as recorded in EpicCare:  Patient Active Problem List   Diagnosis Date Noted   Anxiety 03/03/2021   Benign essential hypertension 05/01/2019   PVC (premature ventricular contraction) 09/18/2014    Current Outpatient Medications  Medication Sig Dispense Refill   escitalopram (LEXAPRO) 10 MG tablet Take 10 mg by mouth daily.     ESTROGEL 0.75 MG/1.25 GM (0.06%) topical gel SMARTSIG:1 pump Topical Daily     hydrochlorothiazide (HYDRODIURIL) 25 MG tablet Take 1 tablet (25 mg total) by mouth daily. 90 tablet 3   progesterone (PROMETRIUM) 100 MG capsule Take 100 mg by mouth daily.     No current facility-administered medications for this visit.    Allergies: Patient has no known allergies.  No past medical history on file.  Past Surgical History:  Procedure Laterality Date   APPENDECTOMY  1984   BREAST LUMPECTOMY Left 2011   CESAREAN SECTION  1995   GALLBLADDER SURGERY  1991    Family History  Problem Relation Age of Onset   Cancer - Lung Mother        Died age 66   Stroke Father    Heart disease Maternal Grandmother        Lived into her 44s    Social History   Tobacco Use   Smoking status: Never   Smokeless tobacco: Never  Substance Use Topics   Alcohol use: Yes    Alcohol/week: 1.0 standard drink of alcohol    Types: 1 Glasses of wine per week    Subjective:   Presents for yearly CPE; does see GYN regularly; up to date on dental and eye exams;  Review of Systems  Constitutional: Negative.   HENT: Negative.    Eyes: Negative.   Respiratory: Negative.    Cardiovascular: Negative.   Gastrointestinal: Negative.   Genitourinary: Negative.   Musculoskeletal: Negative.   Skin: Negative.   Neurological: Negative.   Endo/Heme/Allergies: Negative.   Psychiatric/Behavioral: Negative.      Objective:  Vitals:   03/07/22 1029  BP: 134/88  Pulse: (!) 58  Temp: 98.1 F (36.7 C)  TempSrc: Oral  SpO2:  99%  Weight: 134 lb 9.6 oz (61.1 kg)  Height: $Remove'5\' 5"'muPTLjU$  (1.651 m)    General: Well developed, well nourished, in no acute distress  Skin : Warm and dry.  Head: Normocephalic and atraumatic  Eyes: Sclera and conjunctiva clear; pupils round and reactive to light; extraocular movements intact  Ears: External normal; canals clear; tympanic membranes normal  Oropharynx: Pink, supple. No suspicious lesions  Neck: Supple without thyromegaly, adenopathy  Lungs: Respirations unlabored; clear to auscultation bilaterally without wheeze, rales, rhonchi  CVS exam: normal rate and regular rhythm.  Abdomen: Soft; nontender; nondistended; normoactive bowel sounds; no masses or hepatosplenomegaly  Musculoskeletal: No deformities; no active joint inflammation  Extremities: No edema, cyanosis, clubbing  Vessels: Symmetric bilaterally  Neurologic: Alert and oriented; speech intact; face symmetrical; moves all extremities well; CNII-XII intact without focal deficit   Assessment:  1. PE (physical exam), annual   2. Lipid screening   3. Abnormal laboratory test result   4. Need for hepatitis C screening test     Plan:  Age appropriate preventive healthcare needs addressed; encouraged regular eye doctor and dental exams; encouraged regular exercise; will update labs and refills as needed today; follow-up to be determined; Congratulated patient on commitment to her health; she will reach out to GYN to discuss possibly changing  her HRT; continue to monitor her blood pressure; colonoscopy is due and scheduled for April/ May 2024.   No follow-ups on file.  Orders Placed This Encounter  Procedures   CBC with Differential/Platelet   Comp Met (CMET)   Lipid panel   Hemoglobin A1c   Hepatitis C Antibody    Requested Prescriptions    No prescriptions requested or ordered in this encounter

## 2022-03-08 ENCOUNTER — Encounter: Payer: Self-pay | Admitting: Family

## 2022-03-08 LAB — HEPATITIS C ANTIBODY: Hepatitis C Ab: NONREACTIVE

## 2022-08-28 ENCOUNTER — Other Ambulatory Visit: Payer: Self-pay | Admitting: Obstetrics and Gynecology

## 2022-08-28 DIAGNOSIS — R928 Other abnormal and inconclusive findings on diagnostic imaging of breast: Secondary | ICD-10-CM

## 2022-09-08 ENCOUNTER — Ambulatory Visit
Admission: RE | Admit: 2022-09-08 | Discharge: 2022-09-08 | Disposition: A | Discharge status: Home / Self Care | Payer: 59 | Source: Ambulatory Visit | Attending: Obstetrics and Gynecology | Admitting: Obstetrics and Gynecology

## 2022-09-08 DIAGNOSIS — R928 Other abnormal and inconclusive findings on diagnostic imaging of breast: Secondary | ICD-10-CM

## 2023-03-09 ENCOUNTER — Ambulatory Visit (INDEPENDENT_AMBULATORY_CARE_PROVIDER_SITE_OTHER): Payer: 59 | Admitting: Family

## 2023-03-09 ENCOUNTER — Encounter: Payer: Self-pay | Admitting: Family

## 2023-03-09 VITALS — BP 138/88 | HR 60 | Temp 98.4°F | Resp 18 | Ht 65.0 in | Wt 129.6 lb

## 2023-03-09 DIAGNOSIS — Z0000 Encounter for general adult medical examination without abnormal findings: Secondary | ICD-10-CM | POA: Diagnosis not present

## 2023-03-09 DIAGNOSIS — R7309 Other abnormal glucose: Secondary | ICD-10-CM

## 2023-03-09 DIAGNOSIS — Z13220 Encounter for screening for lipoid disorders: Secondary | ICD-10-CM | POA: Diagnosis not present

## 2023-03-09 DIAGNOSIS — G4700 Insomnia, unspecified: Secondary | ICD-10-CM

## 2023-03-09 LAB — COMPREHENSIVE METABOLIC PANEL
ALT: 18 U/L (ref 0–35)
AST: 28 U/L (ref 0–37)
Albumin: 4.3 g/dL (ref 3.5–5.2)
Alkaline Phosphatase: 35 U/L — ABNORMAL LOW (ref 39–117)
BUN: 15 mg/dL (ref 6–23)
CO2: 29 meq/L (ref 19–32)
Calcium: 9.4 mg/dL (ref 8.4–10.5)
Chloride: 98 meq/L (ref 96–112)
Creatinine, Ser: 0.82 mg/dL (ref 0.40–1.20)
GFR: 79.25 mL/min (ref >60.00)
Glucose, Bld: 103 mg/dL — ABNORMAL HIGH (ref 70–99)
Potassium: 3.9 meq/L (ref 3.5–5.1)
Sodium: 136 meq/L (ref 135–145)
Total Bilirubin: 0.9 mg/dL (ref 0.2–1.2)
Total Protein: 7.1 g/dL (ref 6.0–8.3)

## 2023-03-09 LAB — LIPID PANEL
Cholesterol: 225 mg/dL — ABNORMAL HIGH (ref 0–200)
HDL: 103.8 mg/dL (ref >39.00)
LDL Cholesterol: 93 mg/dL (ref 0–99)
NonHDL: 121.65
Total CHOL/HDL Ratio: 2
Triglycerides: 144 mg/dL (ref 0.0–149.0)
VLDL: 28.8 mg/dL (ref 0.0–40.0)

## 2023-03-09 LAB — CBC WITH DIFFERENTIAL/PLATELET
Basophils Absolute: 0 10*3/uL (ref 0.0–0.1)
Basophils Relative: 0.8 % (ref 0.0–3.0)
Eosinophils Absolute: 0.1 10*3/uL (ref 0.0–0.7)
Eosinophils Relative: 1.2 % (ref 0.0–5.0)
HCT: 38.3 % (ref 36.0–46.0)
Hemoglobin: 13.3 g/dL (ref 12.0–15.0)
Lymphocytes Relative: 63 % — ABNORMAL HIGH (ref 12.0–46.0)
Lymphs Abs: 3.2 10*3/uL (ref 0.7–4.0)
MCHC: 34.7 g/dL (ref 30.0–36.0)
MCV: 98.3 fL (ref 78.0–100.0)
Monocytes Absolute: 0.5 10*3/uL (ref 0.1–1.0)
Monocytes Relative: 10.6 % (ref 3.0–12.0)
Neutro Abs: 1.2 10*3/uL — ABNORMAL LOW (ref 1.4–7.7)
Neutrophils Relative %: 24.4 % — ABNORMAL LOW (ref 43.0–77.0)
Platelets: 257 10*3/uL (ref 150.0–400.0)
RBC: 3.9 Mil/uL (ref 3.87–5.11)
RDW: 12.1 % (ref 11.5–15.5)
WBC: 5 10*3/uL (ref 4.0–10.5)

## 2023-03-09 LAB — TSH: TSH: 4.19 u[IU]/mL (ref 0.35–5.50)

## 2023-03-09 LAB — HEMOGLOBIN A1C: Hgb A1c MFr Bld: 4.7 % (ref 4.6–6.5)

## 2023-03-09 MED ORDER — TRAZODONE HCL 50 MG PO TABS: 25.0000 mg | ORAL_TABLET | Freq: Every evening | ORAL | 2 refills | Status: DC | PRN

## 2023-03-09 MED ORDER — HYDROCHLOROTHIAZIDE 25 MG PO TABS: 25.0000 mg | ORAL_TABLET | Freq: Every day | ORAL | 3 refills | Status: DC

## 2023-03-09 NOTE — Progress Notes (Signed)
Christie Murray is a 57 y.o. female with the following history as recorded in EpicCare:  Patient Active Problem List   Diagnosis Date Noted   Anxiety 03/03/2021   Benign essential hypertension 05/01/2019   PVC (premature ventricular contraction) 09/18/2014    Current Outpatient Medications  Medication Sig Dispense Refill   escitalopram (LEXAPRO) 10 MG tablet Take 10 mg by mouth daily.     ESTROGEL 0.75 MG/1.25 GM (0.06%) topical gel SMARTSIG:1 pump Topical Daily     progesterone (PROMETRIUM) 100 MG capsule Take 100 mg by mouth daily.     traZODone (DESYREL) 50 MG tablet Take 0.5-1 tablets (25-50 mg total) by mouth at bedtime as needed for sleep. 30 tablet 2   hydrochlorothiazide (HYDRODIURIL) 25 MG tablet Take 1 tablet (25 mg total) by mouth daily. 90 tablet 3   No current facility-administered medications for this visit.    Allergies: Patient has no known allergies.  No past medical history on file.  Past Surgical History:  Procedure Laterality Date   APPENDECTOMY  1984   BREAST LUMPECTOMY Left 2011   CESAREAN SECTION  1995   GALLBLADDER SURGERY  1991    Family History  Problem Relation Age of Onset   Cancer - Lung Mother        Died age 79   Stroke Father    Heart disease Maternal Grandmother        Lived into her 50s    Social History   Tobacco Use   Smoking status: Never   Smokeless tobacco: Never  Substance Use Topics   Alcohol use: Yes    Alcohol/week: 1.0 standard drink of alcohol    Types: 1 Glasses of wine per week    Subjective:   Patient presents for yearly CPE; no acute concern today- does see GYN and dermatology regularly; blood pressure is stable at home but does tend to run high in the office; would like to try options for intermittent insomnia;   Review of Systems  Constitutional: Negative.   HENT: Negative.    Eyes: Negative.   Respiratory: Negative.    Cardiovascular: Negative.   Gastrointestinal: Negative.   Genitourinary: Negative.    Musculoskeletal: Negative.   Skin: Negative.   Neurological: Negative.   Endo/Heme/Allergies: Negative.   Psychiatric/Behavioral:  The patient has insomnia.     Objective:  Vitals:   03/09/23 0957  BP: 138/88  Pulse: 60  Resp: 18  Temp: 98.4 F (36.9 C)  TempSrc: Oral  SpO2: 98%  Weight: 129 lb 9.6 oz (58.8 kg)  Height: 5\' 5"  (1.651 m)    General: Well developed, well nourished, in no acute distress  Skin : Warm and dry.  Head: Normocephalic and atraumatic  Eyes: Sclera and conjunctiva clear; pupils round and reactive to light; extraocular movements intact  Ears: External normal; canals clear; tympanic membranes normal  Oropharynx: Pink, supple. No suspicious lesions  Neck: Supple without thyromegaly, adenopathy  Lungs: Respirations unlabored; clear to auscultation bilaterally without wheeze, rales, rhonchi  CVS exam: normal rate and regular rhythm.  Abdomen: Soft; nontender; nondistended; normoactive bowel sounds; no masses or hepatosplenomegaly  Musculoskeletal: No deformities; no active joint inflammation  Extremities: No edema, cyanosis, clubbing  Vessels: Symmetric bilaterally  Neurologic: Alert and oriented; speech intact; face symmetrical; moves all extremities well; CNII-XII intact without focal deficit   Assessment:  1. PE (physical exam), annual   2. Lipid screening   3. Elevated glucose   4. Insomnia, unspecified type  Plan:  Age appropriate preventive healthcare needs addressed; encouraged regular eye doctor and dental exams; encouraged regular exercise; will update labs and refills as needed today; follow-up to be determined; Trial of Trazodone to use as needed- dosage instructions discussed; if no benefit noted, can consider trial of Ambien prn.   No follow-ups on file.  Orders Placed This Encounter  Procedures   CBC with Differential/Platelet   Comp Met (CMET)   Lipid panel   TSH   Hemoglobin A1c    Requested Prescriptions   Signed  Prescriptions Disp Refills   hydrochlorothiazide (HYDRODIURIL) 25 MG tablet 90 tablet 3    Sig: Take 1 tablet (25 mg total) by mouth daily.   traZODone (DESYREL) 50 MG tablet 30 tablet 2    Sig: Take 0.5-1 tablets (25-50 mg total) by mouth at bedtime as needed for sleep.

## 2023-03-10 ENCOUNTER — Encounter: Payer: Self-pay | Admitting: Family

## 2023-03-12 ENCOUNTER — Other Ambulatory Visit: Payer: Self-pay | Admitting: Family

## 2023-03-12 DIAGNOSIS — R899 Unspecified abnormal finding in specimens from other organs, systems and tissues: Secondary | ICD-10-CM

## 2023-03-12 MED ORDER — ZOLPIDEM TARTRATE 5 MG PO TABS: 5.0000 mg | ORAL_TABLET | Freq: Every evening | ORAL | 1 refills | Status: AC | PRN

## 2023-03-13 ENCOUNTER — Other Ambulatory Visit (INDEPENDENT_AMBULATORY_CARE_PROVIDER_SITE_OTHER): Payer: 59

## 2023-03-13 DIAGNOSIS — R899 Unspecified abnormal finding in specimens from other organs, systems and tissues: Secondary | ICD-10-CM | POA: Diagnosis not present

## 2023-03-14 ENCOUNTER — Encounter: Payer: Self-pay | Admitting: Family

## 2023-03-14 LAB — VITAMIN B12: Vitamin B-12: 503 pg/mL (ref 211–911)

## 2023-06-09 ENCOUNTER — Other Ambulatory Visit: Payer: Self-pay | Admitting: Family

## 2023-06-26 ENCOUNTER — Other Ambulatory Visit: Payer: Self-pay | Admitting: Medical Genetics

## 2023-07-13 ENCOUNTER — Other Ambulatory Visit (HOSPITAL_COMMUNITY): Payer: Self-pay | Attending: Medical Genetics

## 2023-07-20 ENCOUNTER — Other Ambulatory Visit (HOSPITAL_COMMUNITY)
Admission: RE | Admit: 2023-07-20 | Discharge: 2023-07-20 | Disposition: A | Discharge status: Home / Self Care | Payer: Self-pay | Source: Ambulatory Visit | Attending: Medical Genetics | Admitting: Medical Genetics

## 2023-08-03 LAB — GENECONNECT MOLECULAR SCREEN: Genetic Analysis Overall Interpretation: NEGATIVE

## 2023-11-12 ENCOUNTER — Other Ambulatory Visit: Payer: Self-pay | Admitting: Obstetrics and Gynecology

## 2023-11-12 DIAGNOSIS — R928 Other abnormal and inconclusive findings on diagnostic imaging of breast: Secondary | ICD-10-CM

## 2023-11-21 ENCOUNTER — Ambulatory Visit
Admission: RE | Admit: 2023-11-21 | Discharge: 2023-11-21 | Disposition: A | Discharge status: Home / Self Care | Source: Ambulatory Visit | Attending: Obstetrics and Gynecology | Admitting: Obstetrics and Gynecology

## 2023-11-21 DIAGNOSIS — R928 Other abnormal and inconclusive findings on diagnostic imaging of breast: Secondary | ICD-10-CM

## 2024-03-11 ENCOUNTER — Encounter: Admitting: Family

## 2024-04-28 ENCOUNTER — Other Ambulatory Visit: Payer: Self-pay | Admitting: Family

## 2024-04-28 NOTE — Telephone Encounter (Unsigned)
 Copied from CRM #[id number]. Topic: Clinical - Medication Refill >> Apr 28, 2024  1:39 PM Aisha D wrote: Medication: hydrochlorothiazide  (HYDRODIURIL ) 25 MG tablet  Has the patient contacted their pharmacy? No (Agent: If no, request that the patient contact the pharmacy for the refill. If patient does not wish to contact the pharmacy document the reason why and proceed with request.) (Agent: If yes, when and what did the pharmacy advise?)  This is the patient's preferred pharmacy:  [id number] [place], [place] - [address] [place] [address] Phone: [phone] Fax: [phone]  Is this the correct pharmacy for this prescription? Yes If no, delete pharmacy and type the correct one.   Has the prescription been filled recently? No  Is the patient out of the medication? No  Has the patient been seen for an appointment in the last year OR does the patient have an upcoming appointment? Yes  Can we respond through MyChart? Yes  Agent: Please be advised that Rx refills may take up to 3 business days. We ask that you follow-up with your pharmacy.

## 2024-04-29 MED ORDER — HYDROCHLOROTHIAZIDE 25 MG PO TABS: 25.0000 mg | ORAL_TABLET | Freq: Every day | ORAL | 3 refills | Status: AC

## 2024-08-08 ENCOUNTER — Encounter: Admitting: Family
